# Patient Record
Sex: Female | Born: 1955 | Race: Black or African American | Hispanic: No | Smoking: Never smoker
Health system: Southern US, Community
[De-identification: ages and names within clinical notes are randomized; demographics above are authoritative.]

## PROBLEM LIST (undated history)

## (undated) DIAGNOSIS — D649 Anemia, unspecified: Secondary | ICD-10-CM

## (undated) HISTORY — PX: OTHER SURGICAL HISTORY: SHX169

## (undated) HISTORY — PX: ABDOMINAL HYSTERECTOMY: SHX81

## (undated) HISTORY — PX: FRACTURE SURGERY: SHX138

## (undated) HISTORY — DX: Anemia, unspecified: D64.9

## (undated) HISTORY — PX: EYE SURGERY: SHX253

---

## 1999-07-08 ENCOUNTER — Ambulatory Visit (HOSPITAL_COMMUNITY): Admission: RE | Admit: 1999-07-08 | Discharge: 1999-07-08 | Payer: Self-pay | Admitting: Obstetrics and Gynecology

## 1999-07-08 ENCOUNTER — Encounter: Payer: Self-pay | Admitting: Obstetrics and Gynecology

## 2001-07-13 ENCOUNTER — Encounter: Payer: Self-pay | Admitting: Emergency Medicine

## 2001-07-13 ENCOUNTER — Emergency Department (HOSPITAL_COMMUNITY): Admission: EM | Admit: 2001-07-13 | Discharge: 2001-07-13 | Payer: Self-pay | Admitting: Emergency Medicine

## 2003-09-28 ENCOUNTER — Emergency Department (HOSPITAL_COMMUNITY): Admission: EM | Admit: 2003-09-28 | Discharge: 2003-09-28 | Payer: Self-pay | Admitting: Emergency Medicine

## 2004-11-03 ENCOUNTER — Ambulatory Visit (HOSPITAL_COMMUNITY): Admission: RE | Admit: 2004-11-03 | Discharge: 2004-11-03 | Payer: Self-pay | Admitting: Obstetrics and Gynecology

## 2004-12-02 ENCOUNTER — Inpatient Hospital Stay (HOSPITAL_COMMUNITY): Admission: RE | Admit: 2004-12-02 | Discharge: 2004-12-04 | Payer: Self-pay | Admitting: Obstetrics and Gynecology

## 2004-12-02 ENCOUNTER — Encounter (INDEPENDENT_AMBULATORY_CARE_PROVIDER_SITE_OTHER): Payer: Self-pay | Admitting: *Deleted

## 2006-04-12 ENCOUNTER — Ambulatory Visit (HOSPITAL_COMMUNITY): Admission: RE | Admit: 2006-04-12 | Discharge: 2006-04-12 | Payer: Self-pay | Admitting: Obstetrics and Gynecology

## 2007-05-29 ENCOUNTER — Ambulatory Visit (HOSPITAL_COMMUNITY): Admission: RE | Admit: 2007-05-29 | Discharge: 2007-05-29 | Payer: Self-pay | Admitting: Obstetrics and Gynecology

## 2008-06-07 ENCOUNTER — Ambulatory Visit (HOSPITAL_COMMUNITY): Admission: RE | Admit: 2008-06-07 | Discharge: 2008-06-07 | Payer: Self-pay | Admitting: Obstetrics and Gynecology

## 2010-03-26 ENCOUNTER — Ambulatory Visit (HOSPITAL_COMMUNITY): Admission: RE | Admit: 2010-03-26 | Discharge: 2010-03-26 | Payer: Self-pay | Admitting: Obstetrics and Gynecology

## 2010-10-04 ENCOUNTER — Encounter: Payer: Self-pay | Admitting: Obstetrics and Gynecology

## 2011-01-29 NOTE — Discharge Summary (Signed)
[name], [name]               ACCOUNT NO.:  [id number]   MEDICAL RECORD NO.:  [id number]          PATIENT TYPE:  INP   LOCATION:  0443                         FACILITY:  [facility]   PHYSICIAN:  Malachi Pro. Ambrose Mantle, M.D. DATE OF BIRTH:  August 04, 1956   DATE OF ADMISSION:  12/02/2004  DATE OF DISCHARGE:  12/04/2004                                 DISCHARGE SUMMARY   HOSPITAL COURSE:  This is a 55 year old black female admitted for abdominal  hysterectomy, bilateral salpingo-oophorectomy because of menorrhagia,  dysmenorrhea and fibroids.  The patient underwent abdominal hysterectomy and  BSO on December 02, 2004, and did well postoperatively, passed flatus,  tolerated a liquid diet, ambulated well, voided well, and was ready for  discharge on the second postop day.  She did have a temperature elevation to  100.5 degrees at 4:00 a.m. on the day of discharge, but the patient feels  well.   LABORATORY DATA:  Her initial hemoglobin was 12.1, hematocrit 35.0, white  count 6300, platelet count 330,000.  Followup hematocrit is 33.9 and 32.5.  Comprehensive metabolic profile was normal.  Urinalysis was negative.  Path  report is pending at this time.   FINAL DIAGNOSES:  1.  Leiomyomata uteri.  2.  Menorrhagia.  3.  Dysmenorrhea.   OPERATION:  Abdominal hysterectomy, bilateral salpingo oophorectomy.   FINAL CONDITION:  Improved.   DISCHARGE INSTRUCTIONS:  Include our regular discharge instructions.  No  vaginal entrance.  No heavy lifting or strenuous activity.  Call with any  temperature elevation greater than 100.4 degrees.  Call with any unusual  problems.  Try to cough and deep breathe, and take her temperature every 4  hours.  She is to return to see me in 3 days to have her staples removed.  Percocet 5/325, 24 tablets, 1 every 4-6 hours as needed for pain, and Ambien  10 mg, 6 tablets, 1 every night as needed for sleep were given at discharge.      TFH/MEDQ  D:  12/04/2004  T:   12/04/2004  Job:  [id number]

## 2011-01-29 NOTE — H&P (Signed)
NAMEJIMMIE, [name]               ACCOUNT NO.:  [id number]   MEDICAL RECORD NO.:  [id number]          PATIENT TYPE:  INP   LOCATION:  NA                           FACILITY:  [facility]   PHYSICIAN:  Malachi Pro. Ambrose Mantle, M.D. DATE OF BIRTH:  08/31/1956   DATE OF ADMISSION:  12/02/2004  DATE OF DISCHARGE:                                HISTORY & PHYSICAL   PRESENT ILLNESS:  This is a 55 year old black female, para 1-0-0-1, admitted  to the hospital for abdominal hysterectomy and bilateral salpingo-  oophorectomy because of menorrhagia, dysmenorrhea, history of right ovarian  cyst, and menstrual difficulties interfering with her life and work and  unresponsive to medical therapy.  Last menstrual period, November 17, 2004 to  November 22, 2004.  Her periods typically occur every 21-28 days, last seven  days.  They are heavy for two days and then become lighter.  She professes  to have severe dysmenorrhea.  She has been treated with Anaprox, Motrin, and  birth control pills over the last few years in an attempt to control her  problem.  She rates her menstrual pain as 7 to 8 out of 10.  She states she  uses 24 pads in two days for her flow.  She claims that she is having heavy  bleeding, that she misses a day of work every month or at least goes in late  for work.  She has been evaluated by pelvic ultrasound on at least two  occasions that showed multiple small fibroids.  The last ultrasound, on  November 02, 2004, showed five fibroids none of which were larger than 3-cm  and a right ovarian cyst that had only one septation.  In 1987, the patient  had a laparoscopic tubal cauterization and at that time the right tube was  adherent to the right ovary in its mid portion and the right ovary was  adherent to the pelvic wall suggesting prior tubal infection but the fimbria  appeared normal.  The patient wants to proceed with hysterectomy and she is  admitted for that now.   PAST MEDICAL HISTORY:  1.   Reveals no known drug allergies.  2.  She is not taking any medication other than Motrin.  3.  She does not drink, does not smoke.   OPERATIONS:  1.  Tubal ligation.  2.  Right knee surgery.  3.  Left wrist surgery.   No significant illnesses, no heart problems.   REVIEW OF SYSTEMS:  Negative review of systems other than as in the present  illness.   FAMILY HISTORY:  Mother approximately 49 but that is not correct because the  patient is 89.  She has high blood pressure.  Her father died at  questionable age of uncertain cause.  Six sisters are living, one with high  blood pressure, one brother has epilepsy.   PHYSICAL EXAMINATION:  HEENT:  Reveal no cranial abnormalities.  VITAL SIGNS:  Blood pressure is 136/78, pulse is 70, weight 142 pounds.  NECK:  Thyroid is not enlarged.  BREASTS:  Soft without masses.  LUNGS:  Clear to  auscultation.  HEART:  Normal size and sound.  No murmurs.  ABDOMEN:  Soft and nontender.  No masses are palpable.  PELVIC:  The vulva and vagina are clean.  STD panel in February was  negative.  Pap smear, in August 2005, was within normal limits.  The cervix  is clean.  The uterus is anterior, somewhat irregular, minimally enlarged.  The adnexa are clear.  RECTAL:  Done in August 2005 was negative.   ADMITTING IMPRESSIONS:  1.  Persistent menorrhagia.  2.  Dysmenorrhea.  3.  History of right ovarian cyst and adnexal adhesions at the time of      laparoscopy unresponsive to medical therapy.   The patient is admitted for abdominal hysterectomy, bilateral salpingo-  oophorectomy.  She was counseled about vaginal hysterectomy versus  abdominal.  She wants her entire pelvis organs removed.  She has been  informed of the risks of surgery including but not limited to heart attack,  stroke, pulmonary embolus, wound disruption, hemorrhage with need for  reoperation and/or transfusion, fistula formation, nerve injury, and  intestinal obstruction.  She also  has been counseled that her sex drive may  be somewhat effected, it is an unknown.  She claims that early in her life  she had the desire for sex about three times a week and now about once a  week.  She understands and agrees to proceed.      TFH/MEDQ  D:  12/01/2004  T:  12/01/2004  Job:  [id number]

## 2011-01-29 NOTE — Op Note (Signed)
[name], GARIEPY               ACCOUNT NO.:  [id number]   MEDICAL RECORD NO.:  [id number]          PATIENT TYPE:  INP   LOCATION:  0443                         FACILITY:  [facility]   PHYSICIAN:  Malachi Pro. Ambrose Mantle, M.D. DATE OF BIRTH:  02-03-1956   DATE OF PROCEDURE:  DATE OF DISCHARGE:                                 OPERATIVE REPORT   PREOPERATIVE DIAGNOSES:  1.  Menorrhagia, dysmenorrhea, uncontrolled by medical therapy.  2.  Fibroids.   POSTOPERATIVE DIAGNOSES:  1.  Menorrhagia, dysmenorrhea, uncontrolled by medical therapy.  2.  Fibroids.  3.  Adhesions in the right lower abdomen.   OPERATION/PROCEDURE:  Abdominal hysterectomy, bilateral salpingo-  oophorectomy.   SURGEON:  Malachi Pro. Ambrose Mantle, M.D.   ASSISTANT:  __________.   ANESTHESIA:  General.   DESCRIPTION OF PROCEDURE:  The patient is brought to the operating room and  placed under satisfactory general anesthesia.  She was placed in frogleg  position.  The abdomen, vulva, vagina, and urethra were prepped with  Betadine solution.  A Foley catheter was inserted to straight drain.  Exam  revealed the uterus to be approximately eight to nine weeks size, irregular  with multiple fibroids.  The adnexa were free of masses.  The patient was  placed supine. The abdomen was draped as a sterile field.  A transverse  incision was made and carried in layers for the skin and subcutaneous tissue  and fascia.  The fascia was then opened transversely, separated from the  rectus muscles superiorly and inferiorly.  The rectus muscle was split in  the midline, peritoneum opened vertically.  There were adhesions in the  right lower abdomen.  It could be that the patient had had a previous  appendicitis.  The appendix appeared normal.  It was somewhat retrocecal.  The adhesions in the right lower quadrant somewhat inhibited the ability to  examine the upper abdomen.  The liver felt smooth.  I could not feel the  gallbladder.  Exploration  of the pelvis revealed a multinodular fibroid  uterus that worsened with adhesions around the right tube and ovary,  adhering it to the posterior broad ligament.  These were dissected free with  blunt dissection.  The left tube and ovary appeared normal except for the  fact that she had had the previous tubal ligation.  Both cul-de-sacs  appeared free of this disease.  Packs and retractors were used to pack the  bowel away from the operative field. I did not dissect any of the adhesions  since they did not interfere with the surgery.  Both round ligaments and  upper pedicles were clamped across.  The round ligaments were divided with  the electrical current.  A bladder flap was developed. The infundibulum  pelvic ligaments were divided between clamps and doubly suture ligated.  The  uterine vessels were skeletonized, clamped, cut, and suture ligated and the  parametrium and the paracervical tissues were clamped, cut and suture  ligated.  Uterosacral ligaments were clamped, cut and suture ligated and  held.  The right vaginal angle was entered and the uterus, tubes and  ovaries  were removed by transecting the upper vagina.  Vaginal angle sutures were  placed and then the central portion of the vagina was closed with  interrupted figure-of-eight sutures of 0 Vicryl.  I established hemostasis  in the pelvis with the electrical current.  No actual sutures were required.  There was a small bleeder on the inferior aspect of the bladder.  I  controlled this with the Bovie but was very delicate with the Bovie in this  area.  Liberal irrigation confirmed hemostasis.  The uterosacral ligaments  were sutured together in the midline.  Peritoneum was reapproximated over  the vaginal cuff.  Both ureters were identified and found to be normal in  shape and caliber.  Packs and retractors were removed and the peritoneum was  closed with a running suture of 0 Vicryl, the rectus muscle with interrupted  0  Vicryl, fascia with two running sutures of 0 Vicryl, subcutaneous with a  running 3-0 Vicryl, and the skin was closed with automatic staples.  The  patient seemed to tolerate the procedure well.  Blood loss was estimated at  100 mL.  Sponge and needle counts were correct.  She was returned to  recovery in satisfactory condition.      TFH/MEDQ  D:  12/02/2004  T:  12/02/2004  Job:  [id number]

## 2011-10-29 ENCOUNTER — Ambulatory Visit (INDEPENDENT_AMBULATORY_CARE_PROVIDER_SITE_OTHER): Payer: Managed Care, Other (non HMO) | Admitting: Family Medicine

## 2011-10-29 VITALS — BP 108/66 | HR 56 | Temp 97.7°F | Resp 14 | Ht 63.0 in | Wt 159.0 lb

## 2011-10-29 DIAGNOSIS — J310 Chronic rhinitis: Secondary | ICD-10-CM

## 2011-10-29 DIAGNOSIS — R05 Cough: Secondary | ICD-10-CM

## 2011-10-29 DIAGNOSIS — R059 Cough, unspecified: Secondary | ICD-10-CM

## 2011-10-29 MED ORDER — HYDROCOD POLST-CHLORPHEN POLST 10-8 MG/5ML PO LQCR: 5.0000 mL | Freq: Every evening | ORAL | Status: AC | PRN

## 2011-10-29 MED ORDER — FLUTICASONE PROPIONATE 50 MCG/ACT NA SUSP: 1.0000 | Freq: Every day | NASAL | Status: DC

## 2011-10-29 MED ORDER — BENZONATATE 200 MG PO CAPS: 200.0000 mg | ORAL_CAPSULE | Freq: Three times a day (TID) | ORAL | Status: AC | PRN

## 2011-10-29 MED ORDER — HYDROCOD POLST-CHLORPHEN POLST 10-8 MG/5ML PO LQCR: 5.0000 mL | Freq: Every evening | ORAL | Status: DC | PRN

## 2011-10-29 MED ORDER — BENZONATATE 200 MG PO CAPS: 200.0000 mg | ORAL_CAPSULE | Freq: Three times a day (TID) | ORAL | Status: DC | PRN

## 2011-10-29 NOTE — Progress Notes (Signed)
  [facility]:  Office Visit  Chief Complaint:  Chief Complaint  Patient presents with  . Cough    chest congestion    HPI: [name] is a 56 y.o. female who complains of  Cough, clear drainage, clear rhinorrhea, No sinus pressure, no fevers, chills. No ear pain. + Sore throat without LAD. X 1 week. Has tried leftover cough medicine with hydrocodone that seems to help and OTC decongestant  Past Medical History  Diagnosis Date  . Anemia    Past Surgical History  Procedure Date  . Abdominal hysterectomy   . Fracture surgery   . Right knee surgery for stitiches    History   Social History  . Marital Status: Divorced    Spouse Name: N/A    Number of Children: N/A  . Years of Education: N/A   Social History Main Topics  . Smoking status: Never Smoker   . Smokeless tobacco: None  . Alcohol Use: No  . Drug Use: No  . Sexually Active: No   Other Topics Concern  . None   Social History Narrative  . None   Family History  Problem Relation Age of Onset  . Diabetes Mother   . Diabetes Father    No Known Allergies Prior to Admission medications   Not on File     ROS: The patient denies fevers, chills, night sweats, unintentional weight loss, chest pain, palpitations, wheezing, dyspnea on exertion, nausea, vomiting, abdominal pain, dysuria, hematuria, melena, numbness, weakness, or tingling. +cough  All other systems have been reviewed and were otherwise negative with the exception of those mentioned in the HPI and as above.    PHYSICAL EXAM: Filed Vitals:   10/29/11 1446  BP: 108/66  Pulse: 56  Temp: 97.7 F (36.5 C)  Resp: 14   Filed Vitals:   10/29/11 1446  Height: 5\' 3"  (1.6 m)  Weight: 159 lb (72.122 kg)   Body mass index is 28.17 kg/(m^2).  General: Alert, no acute distress HEENT:  Normocephalic, atraumatic, oropharynx patent. Op erythema, nasal passage bogginess and erythema.  TM normal Cardiovascular:  Regular rate and  rhythm, no rubs murmurs or gallops.  No Carotid bruits, radial pulse intact. No pedal edema.  Respiratory: Clear to auscultation bilaterally.  No wheezes, rales, or rhonchi.  No cyanosis, no use of accessory musculature GI: No organomegaly, abdomen is soft and non-tender, positive bowel sounds.  No masses. Skin: No rashes. Neurologic: Facial musculature symmetric. Psychiatric: Patient is appropriate throughout our interaction. Lymphatic: No cervical lymphadenopathy Musculoskeletal: Gait intact. Genitourinary:     ASSESSMENT/PLAN: Encounter Diagnoses  Name Primary?  . Cough Yes  . Rhinitis     1. Rx Flonase, Tussionex, Tessalon Perles. No need for Xray since only cough sxs without fever, chills, sinus or chest congestion. If no improvement f/u in 3-4 days for chest xray prn.   Fadumo Heng PHUONG, DO 10/29/2011 3:28 PM

## 2012-04-19 ENCOUNTER — Emergency Department (HOSPITAL_COMMUNITY)
Admission: EM | Admit: 2012-04-19 | Discharge: 2012-04-19 | Disposition: A | Discharge status: Home / Self Care | Payer: No Typology Code available for payment source | Attending: Emergency Medicine | Admitting: Emergency Medicine

## 2012-04-19 DIAGNOSIS — Z043 Encounter for examination and observation following other accident: Secondary | ICD-10-CM | POA: Insufficient documentation

## 2012-04-19 DIAGNOSIS — D649 Anemia, unspecified: Secondary | ICD-10-CM | POA: Insufficient documentation

## 2012-04-19 DIAGNOSIS — M62838 Other muscle spasm: Secondary | ICD-10-CM | POA: Insufficient documentation

## 2012-04-19 MED ORDER — METHOCARBAMOL 500 MG PO TABS: 1000.0000 mg | ORAL_TABLET | Freq: Four times a day (QID) | ORAL | Status: AC

## 2012-04-19 MED ORDER — IBUPROFEN 800 MG PO TABS: 800.0000 mg | ORAL_TABLET | Freq: Three times a day (TID) | ORAL | Status: AC | PRN

## 2012-04-19 NOTE — ED Provider Notes (Signed)
Medical screening examination/treatment/procedure(s) were performed by non-physician practitioner and as supervising physician I was immediately available for consultation/collaboration.   Ramiro Pangilinan, MD 04/19/12 2229 
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
## 2012-11-22 ENCOUNTER — Ambulatory Visit (INDEPENDENT_AMBULATORY_CARE_PROVIDER_SITE_OTHER): Payer: BC Managed Care – PPO | Admitting: Internal Medicine

## 2012-11-22 ENCOUNTER — Other Ambulatory Visit (INDEPENDENT_AMBULATORY_CARE_PROVIDER_SITE_OTHER): Payer: BC Managed Care – PPO

## 2012-11-22 ENCOUNTER — Encounter: Payer: Self-pay | Admitting: Internal Medicine

## 2012-11-22 VITALS — BP 122/82 | HR 58 | Temp 97.0°F | Ht 65.0 in | Wt 159.5 lb

## 2012-11-22 DIAGNOSIS — Z0000 Encounter for general adult medical examination without abnormal findings: Secondary | ICD-10-CM

## 2012-11-22 DIAGNOSIS — Z131 Encounter for screening for diabetes mellitus: Secondary | ICD-10-CM

## 2012-11-22 DIAGNOSIS — Z130 Encounter for screening for diseases of the blood and blood-forming organs and certain disorders involving the immune mechanism: Secondary | ICD-10-CM

## 2012-11-22 DIAGNOSIS — G4700 Insomnia, unspecified: Secondary | ICD-10-CM

## 2012-11-22 DIAGNOSIS — Z1329 Encounter for screening for other suspected endocrine disorder: Secondary | ICD-10-CM

## 2012-11-22 DIAGNOSIS — Z13220 Encounter for screening for lipoid disorders: Secondary | ICD-10-CM

## 2012-11-22 DIAGNOSIS — Z1239 Encounter for other screening for malignant neoplasm of breast: Secondary | ICD-10-CM

## 2012-11-22 LAB — LIPID PANEL
Cholesterol: 170 mg/dL (ref 0–200)
LDL Cholesterol: 102 mg/dL — ABNORMAL HIGH (ref 0–99)
Total CHOL/HDL Ratio: 3
Triglycerides: 43 mg/dL (ref 0.0–149.0)
VLDL: 8.6 mg/dL (ref 0.0–40.0)

## 2012-11-22 LAB — BASIC METABOLIC PANEL
CO2: 29 mEq/L (ref 19–32)
Chloride: 107 mEq/L (ref 96–112)
Creatinine, Ser: 0.7 mg/dL (ref 0.4–1.2)
Potassium: 3.9 mEq/L (ref 3.5–5.1)

## 2012-11-22 LAB — CBC
Hemoglobin: 12.4 g/dL (ref 12.0–15.0)
Platelets: 271 10*3/uL (ref 150.0–400.0)
RDW: 13.6 % (ref 11.5–14.6)
WBC: 7.1 10*3/uL (ref 4.5–10.5)

## 2012-11-22 MED ORDER — ZOLPIDEM TARTRATE 10 MG PO TABS: 10.0000 mg | ORAL_TABLET | Freq: Every evening | ORAL | Status: AC | PRN

## 2012-11-22 NOTE — Patient Instructions (Signed)

## 2012-11-22 NOTE — Progress Notes (Signed)
HPI  Pt presents to the clinic today to establish care. She has not seen a PCP ever, onlu goes to the urgent care/ER if she needs something. She does have some concerns today. 1- she does have some constipation issues. She has a BM about every 2-3 days. She has never taken anything OTC for this. She is wondering if she should take probiotics or not. 2- She has work related insomnia. She does work 2 jobs, one from 3-5 am and the other 8 pm to 2 am. She has trouble sleeping during the day. Because of this she does feel fatigued.  Flu: never Tetanus: more than 10 years PAP: total hysterectomy Mammogram: 2011 Eye doctor: yearly Dentist: yearly Colonoscopy: never  Past Medical History  Diagnosis Date  . Anemia     No current outpatient prescriptions on file.   No current facility-administered medications for this visit.    No Known Allergies  Family History  Problem Relation Age of Onset  . Diabetes Mother   . Hypertension Mother   . Diabetes Father   . Diabetes Sister   . Colon cancer Paternal Aunt   . Stroke Neg Hx   . Early death Neg Hx     History   Social History  . Marital Status: Divorced    Spouse Name: N/A    Number of Children: N/A  . Years of Education: 12   Occupational History  . Film/video editor    Social History Main Topics  . Smoking status: Never Smoker   . Smokeless tobacco: Never Used  . Alcohol Use: No  . Drug Use: No  . Sexually Active: No   Other Topics Concern  . Not on file   Social History Narrative   Regular exercise-no   Caffeine Use-no    ROS:  Constitutional: Pt reports fatigue. Denies fever, malaise, headache or abrupt weight changes.  HEENT: Pt reports wax buildup. Denies eye pain, eye redness, ear pain, ringing in the ears, runny nose, nasal congestion, bloody nose, or sore throat. Respiratory: Denies difficulty breathing, shortness of breath, cough or sputum production.   Cardiovascular: Denies chest pain, chest  tightness, palpitations or swelling in the hands or feet.  Gastrointestinal: Pt reports constipation. Denies abdominal pain, bloating, diarrhea or blood in the stool.  GU: Denies frequency, urgency, pain with urination, blood in urine, odor or discharge. Musculoskeletal: Denies decrease in range of motion, difficulty with gait, muscle pain or joint pain and swelling.  Skin: Denies redness, rashes, lesions or ulcercations.  Neurological: Pt reports insomnia. Denies dizziness, difficulty with memory, difficulty with speech or problems with balance and coordination.   No other specific complaints in a complete review of systems (except as listed in HPI above).  PE:  BP 122/82  Pulse 58  Temp(Src) 97 F (36.1 C) (Oral)  Ht 5\' 5"  (1.651 m)  Wt 159 lb 8 oz (72.349 kg)  BMI 26.54 kg/m2  SpO2 99% Wt Readings from Last 3 Encounters:  11/22/12 159 lb 8 oz (72.349 kg)  10/29/11 159 lb (72.122 kg)    General: Appears her stated age, well developed, well nourished in NAD. HEENT: Head: normal shape and size; Eyes: sclera white, no icterus, conjunctiva pink, PERRLA and EOMs intact; Ears: Tm's gray and intact, normal light reflex; Nose: mucosa pink and moist, septum midline; Throat/Mouth: Teeth present, mucosa pink and moist, no lesions or ulcerations noted.  Neck: Normal range of motion. Neck supple, trachea midline. No massses, lumps or thyromegaly present.  Cardiovascular: Normal rate and rhythm. S1,S2 noted.  No murmur, rubs or gallops noted. No JVD or BLE edema. No carotid bruits noted. Pulmonary/Chest: Normal effort and positive vesicular breath sounds. No respiratory distress. No wheezes, rales or ronchi noted.  Abdomen: Soft and nontender. Normal bowel sounds, no bruits noted. No distention or masses noted. Liver, spleen and kidneys non palpable. Musculoskeletal: Normal range of motion. No signs of joint swelling. No difficulty with gait.  Neurological: Alert and oriented. Cranial nerves  II-XII intact. Coordination normal. +DTRs bilaterally. Psychiatric: Mood and affect normal. Behavior is normal. Judgment and thought content normal.    Assessment and Plan:  Preventative Health Maintenance:  Pt declines flu and tdap today Pt declines to have her colonoscopy set up [facility] Will obtain basic labwork today  Constipation, new:  Gave samples of probiotics that patient can purchase OTC if she wishes Increase your fluid intake  RTC in 1 year or sooner if needed

## 2012-11-22 NOTE — Assessment & Plan Note (Signed)
Will start ambien Call me in 2 weeks and let me know if this has been effective

## 2013-05-26 ENCOUNTER — Ambulatory Visit (INDEPENDENT_AMBULATORY_CARE_PROVIDER_SITE_OTHER): Payer: BC Managed Care – PPO | Admitting: Emergency Medicine

## 2013-05-26 VITALS — BP 108/60 | HR 54 | Temp 98.8°F | Resp 16 | Ht 65.0 in | Wt 152.8 lb

## 2013-05-26 DIAGNOSIS — J018 Other acute sinusitis: Secondary | ICD-10-CM

## 2013-05-26 MED ORDER — AMOXICILLIN 500 MG PO CAPS: 500.0000 mg | ORAL_CAPSULE | Freq: Three times a day (TID) | ORAL | Status: DC

## 2013-05-26 MED ORDER — PSEUDOEPHEDRINE-GUAIFENESIN ER 60-600 MG PO TB12: 1.0000 | ORAL_TABLET | Freq: Two times a day (BID) | ORAL | Status: DC

## 2013-05-26 NOTE — Progress Notes (Signed)
[facility] [address] [place] [address] [phone]  Date:  05/26/2013   Name:  [name]   DOB:  [birth year]   MRN:  [id number]  PCP:  Nicki Reaper, NP    Chief Complaint: URI, Cough and Nasal Congestion   History of Present Illness:  [name] is a 57 y.o. very pleasant female patient who presents with the following:  Ill with 1 week of nasal congestion and post nasal drainage. Has a cough that is not productive.  No wheezing or shortness of breath.  No fever or chills. No stool change or nausea or vomiting.  No improvement with over the counter medications or other home remedies. Denies other complaint or health concern today.   Patient Active Problem List   Diagnosis Date Noted  . Insomnia 11/22/2012    Past Medical History  Diagnosis Date  . Anemia     Past Surgical History  Procedure Laterality Date  . Abdominal hysterectomy    . Fracture surgery      wrist  . Right knee surgery for stitiches    . Eye surgery      History  Substance Use Topics  . Smoking status: Never Smoker   . Smokeless tobacco: Never Used  . Alcohol Use: No    Family History  Problem Relation Age of Onset  . Diabetes Mother   . Hypertension Mother   . Diabetes Father   . Diabetes Sister   . Colon cancer Paternal Aunt   . Stroke Neg Hx   . Early death Neg Hx     No Known Allergies  Medication list has been reviewed and updated.  Current Outpatient Prescriptions on File Prior to Visit  Medication Sig Dispense Refill  . zolpidem (AMBIEN) 10 MG tablet Take 1 tablet (10 mg total) by mouth at bedtime as needed for sleep.  30 tablet  0   No current facility-administered medications on file prior to visit.    Review of Systems:  As per HPI, otherwise negative.    Physical Examination: Filed Vitals:   05/26/13 1105  BP: 108/60  Pulse: 54  Temp: 98.8 F (37.1 C)  Resp: 16   Filed Vitals:   05/26/13 1105  Height: 5\' 5"  (1.651 m)   Weight: 152 lb 12.8 oz (69.31 kg)   Body mass index is 25.43 kg/(m^2). Ideal Body Weight: Weight in (lb) to have BMI = 25: 149.9  GEN: WDWN, NAD, Non-toxic, A & O x 3 HEENT: Atraumatic, Normocephalic. Neck supple. No masses, No LAD. Ears and Nose: No external deformity. CV: RRR, No M/G/R. No JVD. No thrill. No extra heart sounds. PULM: CTA B, no wheezes, crackles, rhonchi. No retractions. No resp. distress. No accessory muscle use. ABD: S, NT, ND, +BS. No rebound. No HSM. EXTR: No c/c/e NEURO Normal gait.  PSYCH: Normally interactive. Conversant. Not depressed or anxious appearing.  Calm demeanor.    Assessment and Plan: Sinusitis Amoxicillin mucinex  Signed,  Phillips Odor, MD

## 2013-05-26 NOTE — Patient Instructions (Signed)

## 2013-11-10 ENCOUNTER — Ambulatory Visit (INDEPENDENT_AMBULATORY_CARE_PROVIDER_SITE_OTHER): Payer: BC Managed Care – PPO | Admitting: Physician Assistant

## 2013-11-10 ENCOUNTER — Other Ambulatory Visit: Payer: Self-pay | Admitting: Internal Medicine

## 2013-11-10 VITALS — BP 120/70 | HR 74 | Temp 97.8°F | Resp 18 | Ht 65.0 in | Wt 155.2 lb

## 2013-11-10 DIAGNOSIS — J069 Acute upper respiratory infection, unspecified: Secondary | ICD-10-CM

## 2013-11-10 DIAGNOSIS — B9789 Other viral agents as the cause of diseases classified elsewhere: Principal | ICD-10-CM

## 2013-11-10 MED ORDER — HYDROCOD POLST-CHLORPHEN POLST 10-8 MG/5ML PO LQCR: 5.0000 mL | Freq: Two times a day (BID) | ORAL | Status: AC

## 2013-11-10 MED ORDER — GUAIFENESIN ER 1200 MG PO TB12: 1.0000 | ORAL_TABLET | Freq: Two times a day (BID) | ORAL | Status: AC

## 2013-11-10 NOTE — Progress Notes (Signed)
   Subjective:    Patient ID: [name], female    DOB: 07/22/1956, 58 y.o.   MRN: [id number]  HPI Pt presents to clinic with 4 day h/o cold symptoms.  She has congestion most nights but has always thought it was related to her dusty work environment.  She is really here because the cough is keeping her up at night and she has had Tussionex in the past and t helped her sleep and would like to sleep tonight.  OTC meds - mucinex D Sick contacts - work Flu vaccine - no  Review of Systems  Constitutional: Negative for fever and chills.  HENT: Positive for congestion, postnasal drip and rhinorrhea (clear).   Respiratory: Positive for cough (dry).        Objective:   Physical Exam  Vitals reviewed. Constitutional: She is oriented to person, place, and time. She appears well-developed and well-nourished.  HENT:  Head: Normocephalic and atraumatic.  Right Ear: Hearing, tympanic membrane, external ear and ear canal normal.  Left Ear: Hearing, tympanic membrane, external ear and ear canal normal.  Nose: Mucosal edema (red) present.  Mouth/Throat: Uvula is midline, oropharynx is clear and moist and mucous membranes are normal.  Eyes: Conjunctivae are normal.  Neck: Normal range of motion.  Cardiovascular: Normal rate, regular rhythm and normal heart sounds.   No murmur heard. Pulmonary/Chest: Effort normal and breath sounds normal. She has no wheezes.  Neurological: She is alert and oriented to person, place, and time.  Skin: Skin is warm and dry.  Psychiatric: She has a normal mood and affect. Her behavior is normal. Judgment and thought content normal.      Assessment & Plan:  Viral URI with cough - Plan: Guaifenesin (MUCINEX MAXIMUM STRENGTH) 1200 MG TB12, chlorpheniramine-HYDROcodone (TUSSIONEX PENNKINETIC ER) 10-8 MG/5ML LQCR  Pt also seems to have some allergies that are likely related to her dusty work environment - she has Flonase at home that she was given in the past but  she never used it - she will try it now to see if it helps with her congestion and she will continue it if it helps with her nighttime congestion.  She will stay hydrated to help with thinning her mucus.  Benny LennertSarah Nadelyn Enriques PA-C 11/10/2013 11:42 AM

## 2013-11-10 NOTE — Patient Instructions (Signed)
Start your Flonase and see if it helps your congestion at night - try it for 2-3 weeks and then stop it to see if it makes a difference - if you need a refill let me know

## 2015-09-09 ENCOUNTER — Other Ambulatory Visit: Payer: Self-pay | Admitting: Obstetrics and Gynecology

## 2015-09-09 DIAGNOSIS — R928 Other abnormal and inconclusive findings on diagnostic imaging of breast: Secondary | ICD-10-CM

## 2015-09-12 ENCOUNTER — Ambulatory Visit
Admission: RE | Admit: 2015-09-12 | Discharge: 2015-09-12 | Disposition: A | Discharge status: Home / Self Care | Payer: BLUE CROSS/BLUE SHIELD | Source: Ambulatory Visit | Attending: Obstetrics and Gynecology | Admitting: Obstetrics and Gynecology

## 2015-09-12 DIAGNOSIS — R928 Other abnormal and inconclusive findings on diagnostic imaging of breast: Secondary | ICD-10-CM

## 2016-08-31 ENCOUNTER — Other Ambulatory Visit: Payer: Self-pay | Admitting: Family Medicine

## 2016-08-31 ENCOUNTER — Other Ambulatory Visit: Payer: Self-pay | Admitting: Obstetrics and Gynecology

## 2016-08-31 DIAGNOSIS — N63 Unspecified lump in unspecified breast: Secondary | ICD-10-CM

## 2016-12-10 ENCOUNTER — Other Ambulatory Visit: Payer: BLUE CROSS/BLUE SHIELD

## 2017-01-19 ENCOUNTER — Ambulatory Visit
Admission: RE | Admit: 2017-01-19 | Discharge: 2017-01-19 | Disposition: A | Discharge status: Home / Self Care | Payer: BLUE CROSS/BLUE SHIELD | Source: Ambulatory Visit | Attending: Obstetrics and Gynecology | Admitting: Obstetrics and Gynecology

## 2017-01-19 ENCOUNTER — Ambulatory Visit: Admission: RE | Admit: 2017-01-19 | Payer: BLUE CROSS/BLUE SHIELD | Source: Ambulatory Visit

## 2017-01-19 DIAGNOSIS — N63 Unspecified lump in unspecified breast: Secondary | ICD-10-CM

## 2018-03-13 ENCOUNTER — Emergency Department (HOSPITAL_COMMUNITY): Payer: BLUE CROSS/BLUE SHIELD

## 2018-03-13 ENCOUNTER — Emergency Department (HOSPITAL_COMMUNITY)
Admission: EM | Admit: 2018-03-13 | Discharge: 2018-03-13 | Disposition: A | Discharge status: Home / Self Care | Payer: BLUE CROSS/BLUE SHIELD | Attending: Emergency Medicine | Admitting: Emergency Medicine

## 2018-03-13 ENCOUNTER — Encounter (HOSPITAL_COMMUNITY): Payer: Self-pay

## 2018-03-13 ENCOUNTER — Ambulatory Visit (HOSPITAL_COMMUNITY)
Admission: EM | Admit: 2018-03-13 | Discharge: 2018-03-13 | Disposition: A | Discharge status: Home / Self Care | Payer: BLUE CROSS/BLUE SHIELD | Attending: Family Medicine | Admitting: Family Medicine

## 2018-03-13 ENCOUNTER — Other Ambulatory Visit: Payer: Self-pay

## 2018-03-13 DIAGNOSIS — G44309 Post-traumatic headache, unspecified, not intractable: Secondary | ICD-10-CM | POA: Diagnosis not present

## 2018-03-13 DIAGNOSIS — S0990XA Unspecified injury of head, initial encounter: Secondary | ICD-10-CM | POA: Diagnosis present

## 2018-03-13 DIAGNOSIS — S161XXA Strain of muscle, fascia and tendon at neck level, initial encounter: Secondary | ICD-10-CM | POA: Diagnosis not present

## 2018-03-13 DIAGNOSIS — Y92410 Unspecified street and highway as the place of occurrence of the external cause: Secondary | ICD-10-CM | POA: Diagnosis not present

## 2018-03-13 DIAGNOSIS — Y999 Unspecified external cause status: Secondary | ICD-10-CM | POA: Diagnosis not present

## 2018-03-13 DIAGNOSIS — R519 Headache, unspecified: Secondary | ICD-10-CM

## 2018-03-13 DIAGNOSIS — Y9389 Activity, other specified: Secondary | ICD-10-CM | POA: Diagnosis not present

## 2018-03-13 DIAGNOSIS — R51 Headache: Secondary | ICD-10-CM

## 2018-03-13 MED ORDER — IBUPROFEN 400 MG PO TABS
600.0000 mg | ORAL_TABLET | Freq: Once | ORAL | Status: AC
  Administered 2018-03-13: 600 mg via ORAL
  Filled 2018-03-13: qty 1

## 2018-03-13 NOTE — ED Notes (Signed)
Pt ambulated w/ steady gait, denies dizziness/lightheadedness or increased pain.
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
## 2018-03-13 NOTE — Discharge Instructions (Addendum)
Recommend further evaluation and management at ED  Patient in agreement and going by private vehicle with daughter to ED Patient discharged in stable condition
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
## 2018-03-19 ENCOUNTER — Other Ambulatory Visit: Payer: Self-pay

## 2018-03-19 ENCOUNTER — Encounter (HOSPITAL_COMMUNITY): Payer: Self-pay | Admitting: Emergency Medicine

## 2018-03-19 ENCOUNTER — Emergency Department (HOSPITAL_COMMUNITY)
Admission: EM | Admit: 2018-03-19 | Discharge: 2018-03-19 | Disposition: A | Discharge status: Home / Self Care | Payer: BLUE CROSS/BLUE SHIELD | Attending: Emergency Medicine | Admitting: Emergency Medicine

## 2018-03-19 DIAGNOSIS — G44319 Acute post-traumatic headache, not intractable: Secondary | ICD-10-CM | POA: Diagnosis not present

## 2018-03-19 DIAGNOSIS — R51 Headache: Secondary | ICD-10-CM | POA: Diagnosis present

## 2018-03-19 MED ORDER — IBUPROFEN 800 MG PO TABS: 800.0000 mg | ORAL_TABLET | Freq: Three times a day (TID) | ORAL | 0 refills | Status: AC
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
## 2018-03-19 NOTE — ED Notes (Signed)
Patient verbalizes understanding of discharge instructions. Opportunity for questioning and answers were provided. Armband removed by staff, pt discharged from ED.  
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
## 2022-11-16 ENCOUNTER — Ambulatory Visit
Admission: EM | Admit: 2022-11-16 | Discharge: 2022-11-16 | Disposition: A | Discharge status: Home / Self Care | Payer: BC Managed Care – PPO | Attending: Internal Medicine | Admitting: Internal Medicine

## 2022-11-16 DIAGNOSIS — H9202 Otalgia, left ear: Secondary | ICD-10-CM | POA: Diagnosis not present

## 2022-11-16 DIAGNOSIS — H6592 Unspecified nonsuppurative otitis media, left ear: Secondary | ICD-10-CM

## 2022-11-16 MED ORDER — FLUTICASONE PROPIONATE 50 MCG/ACT NA SUSP: 1.0000 | Freq: Every day | NASAL | 0 refills | Status: AC | PRN

## 2022-11-16 NOTE — Discharge Instructions (Addendum)
A nasal spray (Flonase) has been sent to your pharmacy to help with the pain in your left ear. Take as directed. This will help with the fluid behind your ear drum. Return in 2 to 3 days if no improvement.

## 2022-11-16 NOTE — ED Provider Notes (Signed)
[facility]    CSN: [id number] Arrival date & time: 11/16/22  1714  History   Chief Complaint Chief Complaint  Patient presents with   Ear Pain    HPI [name] is a 67 y.o. female presenting for left ear pain for the past day.  Denies fever, hearing loss, discharge from the ear, sore throat or cough.  Patient has not taken anything for the pain. Presents NAD.  Past Medical History:  Diagnosis Date   Anemia    Patient Active Problem List   Diagnosis Date Noted   Insomnia 11/22/2012    Past Surgical History:  Procedure Laterality Date   ABDOMINAL HYSTERECTOMY     EYE SURGERY     FRACTURE SURGERY     wrist   right knee surgery for stitiches      Home Medications    Prior to Admission medications   Medication Sig Start Date End Date Taking? Authorizing Provider  fluticasone (FLONASE) 50 MCG/ACT nasal spray Place 1 spray into both nostrils daily as needed. 11/16/22  Yes Samaiya Awadallah P, PA-C  zolpidem (AMBIEN) 10 MG tablet Take 1 tablet (10 mg total) by mouth at bedtime as needed for sleep. 11/22/12 12/22/12  Jearld Fenton, NP   Family History Family History  Problem Relation Age of Onset   Diabetes Mother    Hypertension Mother    Diabetes Father    Diabetes Sister    Colon cancer Paternal Aunt    Stroke Neg Hx    Early death Neg Hx    Social History Social History   Tobacco Use   Smoking status: Never   Smokeless tobacco: Never  Substance Use Topics   Alcohol use: No   Drug use: No    Allergies   Patient has no known allergies.   Review of Systems Review of Systems  Constitutional:  Negative for chills and fever.  HENT:  Positive for ear pain. Negative for congestion, ear discharge, hearing loss and sore throat.   Respiratory:  Negative for cough.     Physical Exam Triage Vital Signs ED Triage Vitals  Enc Vitals Group     BP 11/16/22 1736 133/83     Pulse Rate 11/16/22 1735 74     Resp 11/16/22 1735 17     Temp 11/16/22  1735 97.7 F (36.5 C)     Temp Source 11/16/22 1735 Oral     SpO2 11/16/22 1735 93 %     Weight --      Height --      Head Circumference --      Peak Flow --      Pain Score 11/16/22 1734 9     Pain Loc --      Pain Edu? --      Excl. in [place]? --    No data found.  Updated Vital Signs BP 133/83   Pulse 74   Temp 97.7 F (36.5 C) (Oral)   Resp 17   SpO2 95%   Physical Exam Constitutional:      General: She is not in acute distress.    Appearance: Normal appearance. She is well-developed and normal weight. She is not ill-appearing or toxic-appearing.  HENT:     Head: Normocephalic and atraumatic.     Right Ear: Tympanic membrane and ear canal normal.     Left Ear: Ear canal normal. No drainage or tenderness. A middle ear effusion is present. Tympanic membrane is retracted.  Ears:     Comments: Retracted left TM.  Fluid noted behind left TM.  Nontender to palpation.    Mouth/Throat:     Pharynx: Oropharynx is clear. Uvula midline. No posterior oropharyngeal erythema.     Tonsils: No tonsillar exudate or tonsillar abscesses.  Cardiovascular:     Rate and Rhythm: Normal rate and regular rhythm.     Heart sounds: Normal heart sounds.  Pulmonary:     Effort: Pulmonary effort is normal.     Breath sounds: Normal breath sounds.     Comments: Clear to auscultation bilaterally  Abdominal:     General: Bowel sounds are normal.     Palpations: Abdomen is soft.     Tenderness: There is no abdominal tenderness.  Skin:    General: Skin is warm and dry.  Neurological:     General: No focal deficit present.     Mental Status: She is alert.  Psychiatric:        Mood and Affect: Mood and affect normal.     UC Treatments / Results  Labs (all labs ordered are listed, but only abnormal results are displayed) Labs Reviewed - No data to display  Medications Ordered in UC Medications - No data to display  Initial Impression / Assessment and Plan / UC Course  I have reviewed  the triage vital signs and the nursing notes.  Pertinent labs & imaging results that were available during my care of the patient were reviewed by me and considered in my medical decision making (see chart for details).  Left Otalgia: Afebrile, nontoxic-appearing, NAD. VSS. DDX includes but not limited to: secondary to eustachian tube dysfunction, bacterial otitis media, viral otitis media PE was unremarkable except for retraction and fluid noted behind the TM.  Recommend nasal spray.  Suspect eustachian tube dysfunction. Strict ED precautions were given and patient verbalized understanding.  Fluid Level Behind TM: DDX includes but not limited to: secondary to eustachian tube dysfunction, bacterial otitis media, viral otitis media PE was unremarkable except for retraction and fluid noted behind the TM.  Recommend nasal spray.  Suspect eustachian tube dysfunction. Strict ED precautions were given and patient verbalized understanding.  Final Clinical Impressions(s) / UC Diagnoses   Final diagnoses:  Acute otalgia, left  Fluid level behind tympanic membrane of left ear     Discharge Instructions      A nasal spray (Flonase) has been sent to your pharmacy to help with the pain in your left ear. Take as directed. This will help with the fluid behind your ear drum. Return in 2 to 3 days if no improvement.      ED Prescriptions     Medication Sig Dispense Auth. Provider   fluticasone (FLONASE) 50 MCG/ACT nasal spray Place 1 spray into both nostrils daily as needed. 15.8 mL Misheel Gowans P, PA-C      PDMP not reviewed this encounter.   Carmie End, PA-C 11/16/22 1905

## 2022-11-16 NOTE — ED Triage Notes (Signed)
Pt presents with c/o left ear pain X this morning. States she has not taken anything for relief. States nothing is making it better or worse.

## 2023-07-02 ENCOUNTER — Other Ambulatory Visit (HOSPITAL_BASED_OUTPATIENT_CLINIC_OR_DEPARTMENT_OTHER): Payer: Self-pay

## 2023-07-02 ENCOUNTER — Emergency Department (HOSPITAL_BASED_OUTPATIENT_CLINIC_OR_DEPARTMENT_OTHER): Payer: Worker's Compensation | Admitting: Radiology

## 2023-07-02 ENCOUNTER — Encounter (HOSPITAL_BASED_OUTPATIENT_CLINIC_OR_DEPARTMENT_OTHER): Payer: Self-pay

## 2023-07-02 ENCOUNTER — Emergency Department (HOSPITAL_BASED_OUTPATIENT_CLINIC_OR_DEPARTMENT_OTHER)
Admission: EM | Admit: 2023-07-02 | Discharge: 2023-07-02 | Disposition: A | Discharge status: Home / Self Care | Payer: Worker's Compensation | Attending: Emergency Medicine | Admitting: Emergency Medicine

## 2023-07-02 ENCOUNTER — Other Ambulatory Visit: Payer: Self-pay

## 2023-07-02 DIAGNOSIS — R0789 Other chest pain: Secondary | ICD-10-CM | POA: Insufficient documentation

## 2023-07-02 DIAGNOSIS — Y92410 Unspecified street and highway as the place of occurrence of the external cause: Secondary | ICD-10-CM | POA: Diagnosis not present

## 2023-07-02 LAB — CBC WITH DIFFERENTIAL/PLATELET
Abs Immature Granulocytes: 0.02 10*3/uL (ref 0.00–0.07)
Basophils Absolute: 0 10*3/uL (ref 0.0–0.1)
Basophils Relative: 1 %
Eosinophils Absolute: 0 10*3/uL (ref 0.0–0.5)
Eosinophils Relative: 0 %
HCT: 38.2 % (ref 36.0–46.0)
Hemoglobin: 12.9 g/dL (ref 12.0–15.0)
Immature Granulocytes: 0 %
Lymphocytes Relative: 35 %
Lymphs Abs: 2.1 10*3/uL (ref 0.7–4.0)
MCH: 31.5 pg (ref 26.0–34.0)
MCHC: 33.8 g/dL (ref 30.0–36.0)
MCV: 93.4 fL (ref 80.0–100.0)
Monocytes Absolute: 0.4 10*3/uL (ref 0.1–1.0)
Monocytes Relative: 7 %
Neutro Abs: 3.5 10*3/uL (ref 1.7–7.7)
Neutrophils Relative %: 57 %
Platelets: 255 10*3/uL (ref 150–400)
RBC: 4.09 MIL/uL (ref 3.87–5.11)
RDW: 13.5 % (ref 11.5–15.5)
WBC: 6.1 10*3/uL (ref 4.0–10.5)
nRBC: 0 % (ref 0.0–0.2)

## 2023-07-02 LAB — BASIC METABOLIC PANEL
Anion gap: 6 (ref 5–15)
BUN: 16 mg/dL (ref 8–23)
CO2: 29 mmol/L (ref 22–32)
Calcium: 9.9 mg/dL (ref 8.9–10.3)
Chloride: 107 mmol/L (ref 98–111)
Creatinine, Ser: 0.89 mg/dL (ref 0.44–1.00)
GFR, Estimated: >60 mL/min (ref >60)
Glucose, Bld: 117 mg/dL — ABNORMAL HIGH (ref 70–99)
Potassium: 4 mmol/L (ref 3.5–5.1)
Sodium: 142 mmol/L (ref 135–145)

## 2023-07-02 LAB — TROPONIN I (HIGH SENSITIVITY): Troponin I (High Sensitivity): 2 ng/L (ref <18)

## 2023-07-02 NOTE — Discharge Instructions (Addendum)
Thank you for allowing [place] to be a part of your care today.  Your workup today is overall reassuring.  There was no evidence of any broken bones on your chest x-ray.  Your cardiac workup was also reassuring.  I recommend taking [phone] milligrams of Tylenol as needed for pain.  You can also use a heating pad to the chest to help with discomfort.  Return to the ED if you develop sudden worsening of your symptoms or if you have any new concerns.  Follow-up with your primary care doctor if you have persistent symptoms that do not prove with conservative treatment.
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
## 2023-07-02 NOTE — ED Notes (Signed)
Patient transported to X-ray
